# Patient Record
Sex: Male | Born: 1980 | Race: Black or African American | Hispanic: No | Marital: Single | Smoking: Current every day smoker
Health system: Southern US, Community
[De-identification: ages and names within clinical notes are randomized; demographics above are authoritative.]

## PROBLEM LIST (undated history)

## (undated) DIAGNOSIS — E139 Other specified diabetes mellitus without complications: Secondary | ICD-10-CM

## (undated) HISTORY — PX: OTHER SURGICAL HISTORY: SHX169

## (undated) HISTORY — PX: VASECTOMY: SHX75

## (undated) HISTORY — DX: Other specified diabetes mellitus without complications: E13.9

---

## 2009-10-26 ENCOUNTER — Emergency Department (HOSPITAL_COMMUNITY): Admission: EM | Admit: 2009-10-26 | Discharge: 2009-10-26 | Payer: Self-pay | Admitting: Emergency Medicine

## 2010-07-18 IMAGING — CR DG ABDOMEN ACUTE W/ 1V CHEST
3 series · 3 of 3 positions shown · non-contrast
Comparison: None.

CLINICAL DATA: 28-year-old male with abdominal pain, nausea,
vomiting, hyperglycemia.

ACUTE ABDOMEN SERIES (ABDOMEN 2 VIEW & CHEST 1 VIEW)

[w chest pa]
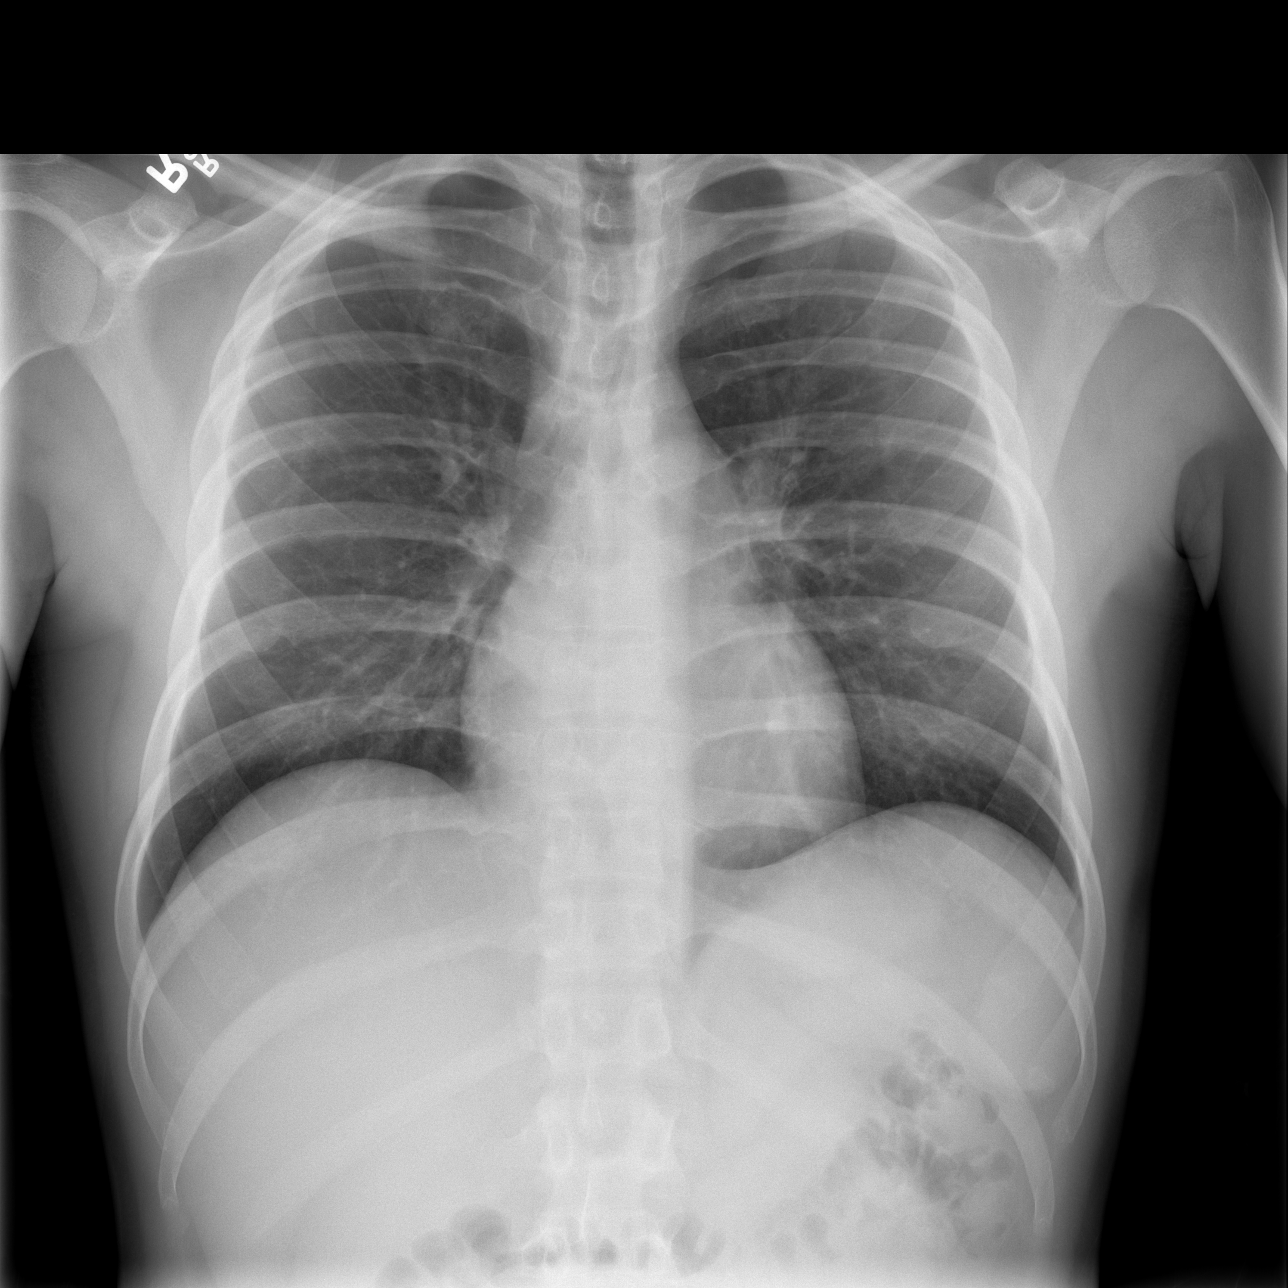

[w abdomen upright]
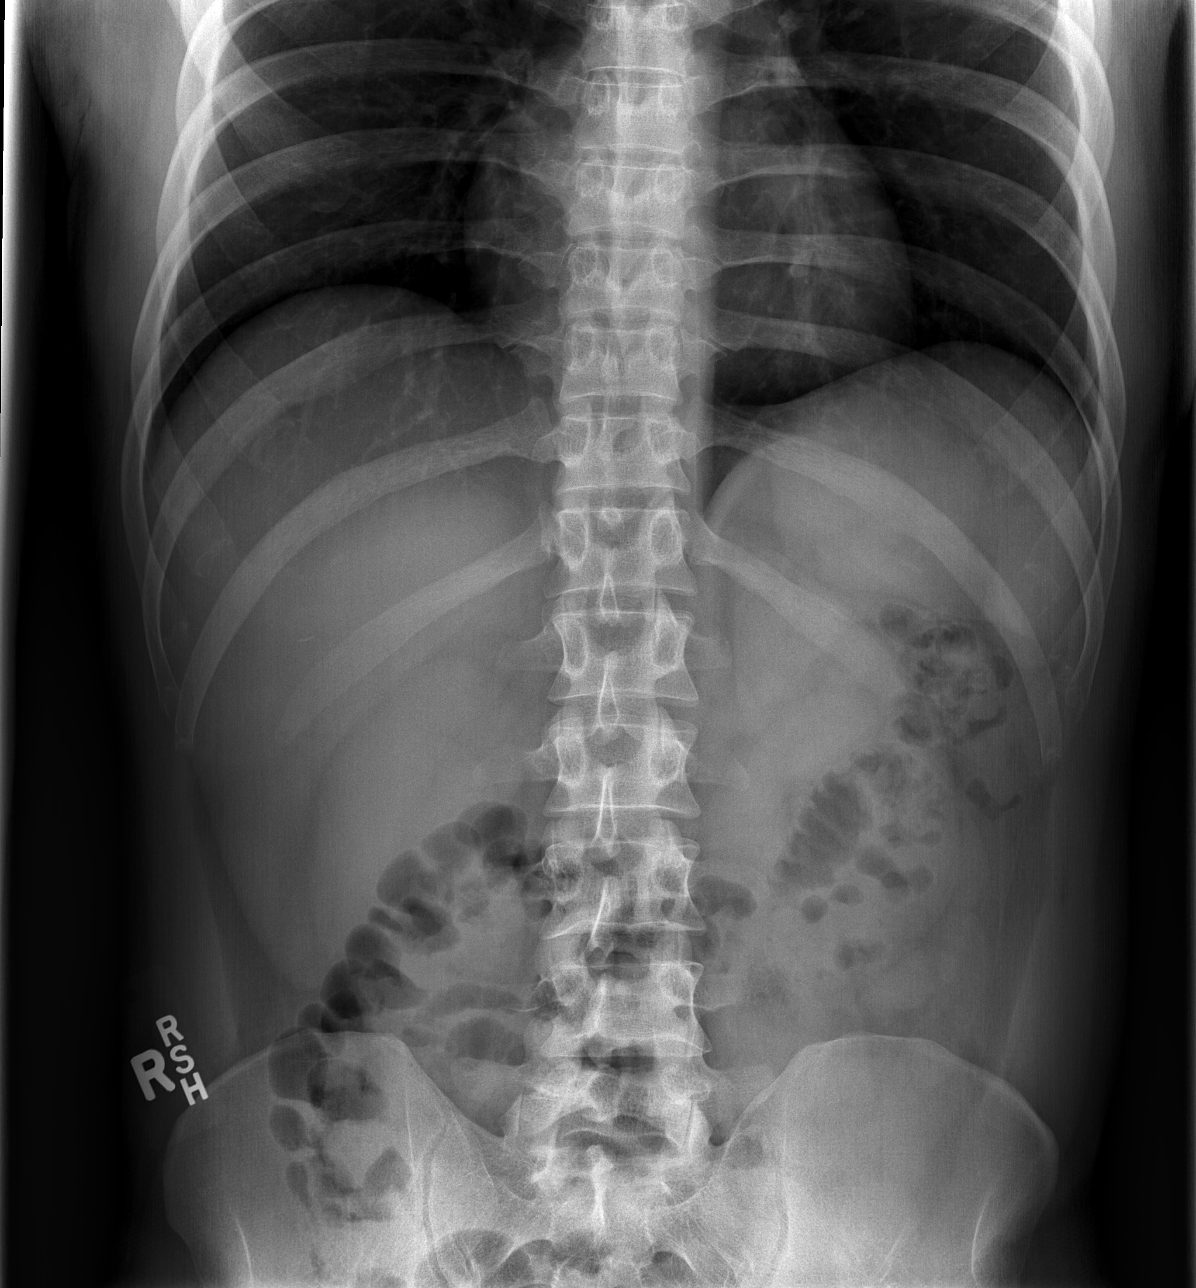

[t abdomen supine]
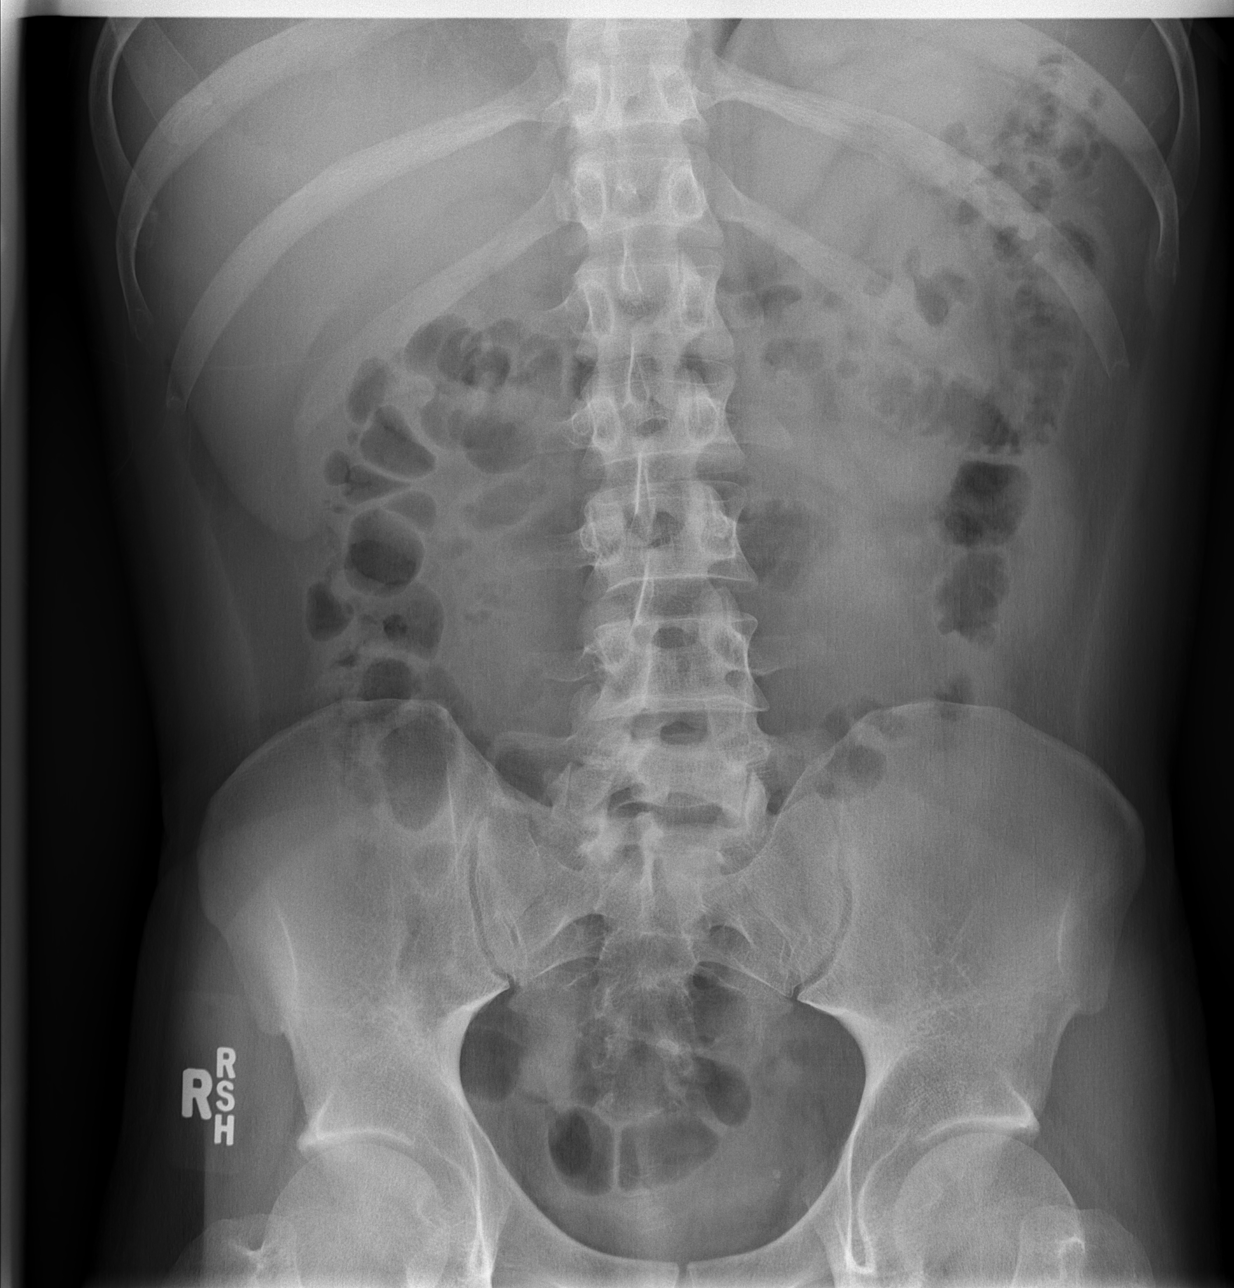

[3 of 3 positions shown; findings below may reference images not displayed]

FINDINGS: Slightly low lung volumes. Normal cardiac size and
mediastinal contours.  No pneumothorax or pneumoperitoneum.  The
lungs are clear.

Normal bowel gas pattern.  Abdominal visceral contours are within
normal limits. No osseous abnormality identified.
IMPRESSION: Negative; normal bowel gas pattern, no free air, and no
cardiopulmonary abnormality.

## 2010-11-23 LAB — DIFFERENTIAL
Basophils Absolute: 0 10*3/uL (ref 0.0–0.1)
Eosinophils Relative: 0 % (ref 0–5)
Eosinophils Relative: 1 % (ref 0–5)
Lymphs Abs: 2.1 10*3/uL (ref 0.7–4.0)
Monocytes Absolute: 0.8 10*3/uL (ref 0.1–1.0)
Monocytes Relative: 6 % (ref 3–12)
Neutro Abs: 15.9 10*3/uL — ABNORMAL HIGH (ref 1.7–7.7)
Neutro Abs: 8.4 10*3/uL — ABNORMAL HIGH (ref 1.7–7.7)
Neutrophils Relative %: 68 % (ref 43–77)

## 2010-11-23 LAB — CBC
HCT: 45 % (ref 39.0–52.0)
Hemoglobin: 15.9 g/dL (ref 13.0–17.0)
MCHC: 35.4 g/dL (ref 30.0–36.0)
Platelets: 168 10*3/uL (ref 150–400)
RBC: 4.88 MIL/uL (ref 4.22–5.81)
RDW: 12.7 % (ref 11.5–15.5)
WBC: 19.2 10*3/uL — ABNORMAL HIGH (ref 4.0–10.5)

## 2010-11-23 LAB — URINALYSIS, ROUTINE W REFLEX MICROSCOPIC
Bilirubin Urine: NEGATIVE
Specific Gravity, Urine: 1.036 — ABNORMAL HIGH (ref 1.005–1.030)
pH: 5.5 (ref 5.0–8.0)

## 2010-11-23 LAB — GLUCOSE, CAPILLARY
Glucose-Capillary: 107 mg/dL — ABNORMAL HIGH (ref 70–99)
Glucose-Capillary: 109 mg/dL — ABNORMAL HIGH (ref 70–99)
Glucose-Capillary: 136 mg/dL — ABNORMAL HIGH (ref 70–99)

## 2010-11-23 LAB — RAPID URINE DRUG SCREEN, HOSP PERFORMED
Barbiturates: NOT DETECTED
Benzodiazepines: NOT DETECTED
Cocaine: NOT DETECTED
Opiates: POSITIVE — AB

## 2010-11-23 LAB — BASIC METABOLIC PANEL
GFR calc non Af Amer: >60 mL/min (ref >60)
Potassium: 3.6 mEq/L (ref 3.5–5.1)

## 2010-11-23 LAB — HEPATIC FUNCTION PANEL
ALT: 21 U/L (ref 0–53)
Alkaline Phosphatase: 83 U/L (ref 39–117)
Indirect Bilirubin: 0.8 mg/dL (ref 0.3–0.9)
Total Bilirubin: 0.9 mg/dL (ref 0.3–1.2)

## 2019-12-16 ENCOUNTER — Other Ambulatory Visit: Payer: Self-pay

## 2019-12-16 ENCOUNTER — Encounter: Payer: Self-pay | Admitting: Family Medicine

## 2019-12-16 ENCOUNTER — Ambulatory Visit: Payer: BC Managed Care – PPO | Admitting: Osteopathic Medicine

## 2019-12-16 ENCOUNTER — Ambulatory Visit (INDEPENDENT_AMBULATORY_CARE_PROVIDER_SITE_OTHER): Payer: BC Managed Care – PPO | Admitting: Family Medicine

## 2019-12-16 VITALS — BP 130/95 | HR 84 | Wt 122.0 lb

## 2019-12-16 DIAGNOSIS — E1040 Type 1 diabetes mellitus with diabetic neuropathy, unspecified: Secondary | ICD-10-CM | POA: Diagnosis not present

## 2019-12-16 DIAGNOSIS — R634 Abnormal weight loss: Secondary | ICD-10-CM

## 2019-12-16 NOTE — Patient Instructions (Signed)
Diabetes Mellitus and Standards of Medical Care Managing diabetes (diabetes mellitus) can be complicated. Your diabetes treatment may be managed by a team of health care providers, including:  A physician who specializes in diabetes (endocrinologist).  A nurse practitioner or physician assistant.  Nurses.  A diet and nutrition specialist (registered dietitian).  A certified diabetes educator (CDE).  An exercise specialist.  A pharmacist.  An eye doctor.  A foot specialist (podiatrist).  A dentist.  A primary care provider.  A mental health provider. Your health care providers follow guidelines to help you get the best quality of care. The following schedule is a general guideline for your diabetes management plan. Your health care providers may give you more specific instructions. Physical exams Upon being diagnosed with diabetes mellitus, and each year after that, your health care provider will ask about your medical and family history. He or she will also do a physical exam. Your exam may include:  Measuring your height, weight, and body mass index (BMI).  Checking your blood pressure. This will be done at every routine medical visit. Your target blood pressure may vary depending on your medical conditions, your age, and other factors.  Thyroid gland exam.  Skin exam.  Screening for damage to your nerves (peripheral neuropathy). This may include checking the pulse in your legs and feet and checking the level of sensation in your hands and feet.  A complete foot exam to inspect the structure and skin of your feet, including checking for cuts, bruises, redness, blisters, sores, or other problems.  Screening for blood vessel (vascular) problems, which may include checking the pulse in your legs and feet and checking your temperature. Blood tests Depending on your treatment plan and your personal needs, you may have the following tests done:  HbA1c (hemoglobin A1c). This  test provides information about blood sugar (glucose) control over the previous 2-3 months. It is used to adjust your treatment plan, if needed. This test will be done: ? At least 2 times a year, if you are meeting your treatment goals. ? 4 times a year, if you are not meeting your treatment goals or if treatment goals have changed.  Lipid testing, including total, LDL, and HDL cholesterol and triglyceride levels. ? The goal for LDL is less than 100 mg/dL (5.5 mmol/L). If you are at high risk for complications, the goal is less than 70 mg/dL (3.9 mmol/L). ? The goal for HDL is 40 mg/dL (2.2 mmol/L) or higher for men and 50 mg/dL (2.8 mmol/L) or higher for women. An HDL cholesterol of 60 mg/dL (3.3 mmol/L) or higher gives some protection against heart disease. ? The goal for triglycerides is less than 150 mg/dL (8.3 mmol/L).  Liver function tests.  Kidney function tests.  Thyroid function tests. Dental and eye exams  Visit your dentist two times a year.  If you have type 1 diabetes, your health care provider may recommend an eye exam 3-5 years after you are diagnosed, and then once a year after your first exam. ? For children with type 1 diabetes, a health care provider may recommend an eye exam when your child is age 10 or older and has had diabetes for 3-5 years. After the first exam, your child should get an eye exam once a year.  If you have type 2 diabetes, your health care provider may recommend an eye exam as soon as you are diagnosed, and then once a year after your first exam. Immunizations   The   yearly flu (influenza) vaccine is recommended for everyone 6 months or older who has diabetes.  The pneumonia (pneumococcal) vaccine is recommended for everyone 2 years or older who has diabetes. If you are 65 or older, you may get the pneumonia vaccine as a series of two separate shots.  The hepatitis B vaccine is recommended for adults shortly after being diagnosed with  diabetes.  Adults and children with diabetes should receive all other vaccines according to age-specific recommendations from the Centers for Disease Control and Prevention (CDC). Mental and emotional health Screening for symptoms of eating disorders, anxiety, and depression is recommended at the time of diagnosis and afterward as needed. If your screening shows that you have symptoms (positive screening result), you may need more evaluation and you may work with a mental health care provider. Treatment plan Your treatment plan will be reviewed at every medical visit. You and your health care provider will discuss:  How you are taking your medicines, including insulin.  Any side effects you are experiencing.  Your blood glucose target goals.  The frequency of your blood glucose monitoring.  Lifestyle habits, such as activity level as well as tobacco, alcohol, and substance use. Diabetes self-management education Your health care provider will assess how well you are monitoring your blood glucose levels and whether you are taking your insulin correctly. He or she may refer you to:  A certified diabetes educator to manage your diabetes throughout your life, starting at diagnosis.  A registered dietitian who can create or review your personal nutrition plan.  An exercise specialist who can discuss your activity level and exercise plan. Summary  Managing diabetes (diabetes mellitus) can be complicated. Your diabetes treatment may be managed by a team of health care providers.  Your health care providers follow guidelines in order to help you get the best quality of care.  Standards of care including having regular physical exams, blood tests, blood pressure monitoring, immunizations, screening tests, and education about how to manage your diabetes.  Your health care providers may also give you more specific instructions based on your individual health. This information is not intended  to replace advice given to you by your health care provider. Make sure you discuss any questions you have with your health care provider. Document Revised: 05/08/2018 Document Reviewed: 05/17/2016 Elsevier Patient Education  2020 [employer]  

## 2019-12-16 NOTE — Assessment & Plan Note (Signed)
Likely related to untreated diabetes.  Check TSH today as well.

## 2019-12-16 NOTE — Progress Notes (Signed)
Jon Harding - 39 y.o. male MRN [id number]  Date of birth: [birth year]  Subjective Chief Complaint  Patient presents with  . Establish Care    HPI Jon Harding is a 39 y.o. male with history of diabetes here today for initial visit.  Reports diagnosis of diabetes in his mid 20's.   Started having symptoms nausea and increased thirst and urination.  Went to hospital and blood sugar was 800.  He states that he was told that he could have type 1 or type 2 diabetes but based on age, body habitus and glucose levels that he most likely had type 1 diabetes.  He has not had regular care for several years due to being uninsured.  He has been checking blood sugars and using his mothers insulin to help manage sugars.  He is unsure what type of insulin he is using. He reports that blood sugars are typically around 200-300.  He has had weight loss but denies increased thirst, urination or fatigue.  He is also having burning and tingling pain in bilateral legs and feet.   He is also a daily smoker, both cigarettes and marijuana.  Not ready to quit at this time.   ROS:  A comprehensive ROS was completed and negative except as noted per HPI   No Known Allergies  Past Medical History:  Diagnosis Date  . Diabetes 1.5, managed as type 1 [facility])     Past Surgical History:  Procedure Laterality Date  . Broken Bone Repair    . VASECTOMY      Social History   Socioeconomic History  . Marital status: Single    Spouse name: Not on file  . Number of children: Not on file  . Years of education: Not on file  . Highest education level: Not on file  Occupational History  . Occupation: [employer]   Tobacco Use  . Smoking status: Current Every Day Smoker    Packs/day: 1.00    Years: 27.00    Pack years: 27.00    Types: Cigarettes    Start date: 09/02/1992  . Smokeless tobacco: Never Used  Substance and Sexual Activity  . Alcohol use: Not on file    Comment: rarely  . Drug use: Yes    Frequency: 7.0  times per week    Types: Marijuana    Comment: daily  . Sexual activity: Yes    Partners: Female  Other Topics Concern  . Not on file  Social History Narrative  . Not on file   Social Determinants of Health   Financial Resource Strain:   . Difficulty of Paying Living Expenses:   Food Insecurity:   . Worried About Programme researcher, broadcasting/film/video in the Last Year:   . Barista in the Last Year:   Transportation Needs:   . Freight forwarder (Medical):   Marland Kitchen Lack of Transportation (Non-Medical):   Physical Activity:   . Days of Exercise per Week:   . Minutes of Exercise per Session:   Stress:   . Feeling of Stress :   Social Connections:   . Frequency of Communication with Friends and Family:   . Frequency of Social Gatherings with Friends and Family:   . Attends Religious Services:   . Active Member of Clubs or Organizations:   . Attends Banker Meetings:   Marland Kitchen Marital Status:     Family History  Problem Relation Age of Onset  . Stroke Mother   . Diabetes  Mother   . Hypertension Mother   . Heart attack Brother   . Hypertension Maternal Uncle   . Stroke Maternal Uncle   . Hypertension Maternal Grandmother     Health Maintenance  Topic Date Due  . URINE MICROALBUMIN  Never done  . HIV Screening  Never done  . TETANUS/TDAP  05/31/2000  . INFLUENZA VACCINE  04/02/2020     ----------------------------------------------------------------------------------------------------------------------------------------------------------------------------------------------------------------- Physical Exam BP (!) 130/95 (BP Location: Right Arm, Patient Position: Sitting, Cuff Size: Small)   Pulse 84   Wt 122 lb (55.3 kg)   SpO2 98%   Physical Exam Constitutional:      Comments: Thin male.   Eyes:     General: No scleral icterus. Cardiovascular:     Rate and Rhythm: Normal rate and regular rhythm.  Pulmonary:     Effort: Pulmonary effort is normal.     Breath  sounds: Normal breath sounds.  Musculoskeletal:     Cervical back: Neck supple.  Skin:    General: Skin is warm and dry.  Neurological:     General: No focal deficit present.     Mental Status: He is alert.  Psychiatric:        Mood and Affect: Mood normal.        Behavior: Behavior normal.     ------------------------------------------------------------------------------------------------------------------------------------------------------------------------------------------------------------------- Assessment and Plan  Weight loss Likely related to untreated diabetes.  Check TSH today as well.   Type 1 diabetes mellitus with diabetic neuropathy, unspecified (HCC) Check a1c, c-peptide, cmp, lipid panel and microalbumin May have had DKA vs HONK previously when first diagnosed based on history.   Discussed standards of care including recommendations of annual eye exam.   Recommend quitting smoking.   Has neuropathy as well, likely related to diabetes.  Discussed trying gabapentin once labs return.    No orders of the defined types were placed in this encounter.   No follow-ups on file.    This visit occurred during the SARS-CoV-2 public health emergency.  Safety protocols were in place, including screening questions prior to the visit, additional usage of staff PPE, and extensive cleaning of exam room while observing appropriate contact time as indicated for disinfecting solutions.

## 2019-12-16 NOTE — Assessment & Plan Note (Signed)
Check a1c, c-peptide, cmp, lipid panel and microalbumin May have had DKA vs HONK previously when first diagnosed based on history.   Discussed standards of care including recommendations of annual eye exam.   Recommend quitting smoking.   Has neuropathy as well, likely related to diabetes.  Discussed trying gabapentin once labs return.

## 2019-12-17 LAB — CBC
HCT: 42.7 % (ref 38.5–50.0)
Hemoglobin: 14.4 g/dL (ref 13.2–17.1)
MCH: 30.5 pg (ref 27.0–33.0)
MCHC: 33.7 g/dL (ref 32.0–36.0)
MCV: 90.5 fL (ref 80.0–100.0)
MPV: 10.1 fL (ref 7.5–12.5)
Platelets: 244 10*3/uL (ref 140–400)
RBC: 4.72 10*6/uL (ref 4.20–5.80)
RDW: 12.5 % (ref 11.0–15.0)
WBC: 8.3 10*3/uL (ref 3.8–10.8)

## 2019-12-17 LAB — C-PEPTIDE: C-Peptide: 2.09 ng/mL (ref 0.80–3.85)

## 2019-12-17 LAB — COMPLETE METABOLIC PANEL WITH GFR
AG Ratio: 1.5 (calc) (ref 1.0–2.5)
ALT: 10 U/L (ref 9–46)
AST: 9 U/L — ABNORMAL LOW (ref 10–40)
Albumin: 4.2 g/dL (ref 3.6–5.1)
Alkaline phosphatase (APISO): 78 U/L (ref 36–130)
BUN: 8 mg/dL (ref 7–25)
CO2: 30 mmol/L (ref 20–32)
Calcium: 9.7 mg/dL (ref 8.6–10.3)
Chloride: 104 mmol/L (ref 98–110)
Creat: 0.68 mg/dL (ref 0.60–1.35)
GFR, Est African American: 138 mL/min/{1.73_m2} (ref >=60)
GFR, Est Non African American: 119 mL/min/{1.73_m2} (ref >=60)
Globulin: 2.8 g/dL (calc) (ref 1.9–3.7)
Glucose, Bld: 323 mg/dL — ABNORMAL HIGH (ref 65–139)
Potassium: 4.7 mmol/L (ref 3.5–5.3)
Sodium: 139 mmol/L (ref 135–146)
Total Bilirubin: 0.3 mg/dL (ref 0.2–1.2)
Total Protein: 7 g/dL (ref 6.1–8.1)

## 2019-12-17 LAB — LIPID PANEL
Cholesterol: 131 mg/dL (ref <200)
HDL: 39 mg/dL — ABNORMAL LOW (ref >=40)
LDL Cholesterol (Calc): 64 mg/dL (calc)
Non-HDL Cholesterol (Calc): 92 mg/dL (calc) (ref <130)
Total CHOL/HDL Ratio: 3.4 (calc) (ref <5.0)
Triglycerides: 216 mg/dL — ABNORMAL HIGH (ref <150)

## 2019-12-17 LAB — TSH: TSH: 1.11 mIU/L (ref 0.40–4.50)

## 2019-12-17 LAB — MICROALBUMIN / CREATININE URINE RATIO
Creatinine, Urine: 47 mg/dL (ref 20–320)
Microalb Creat Ratio: 19 mcg/mg creat (ref <30)
Microalb, Ur: 0.9 mg/dL

## 2019-12-21 ENCOUNTER — Other Ambulatory Visit: Payer: Self-pay | Admitting: Family Medicine

## 2019-12-22 MED ORDER — FREESTYLE LIBRE 14 DAY SENSOR MISC: 1 refills | Status: AC

## 2019-12-22 MED ORDER — FREESTYLE LIBRE 14 DAY READER DEVI
1.0000 | Freq: Every day | 0 refills | Status: AC
Start: 2019-12-22 — End: ?

## 2019-12-22 MED ORDER — INSULIN PEN NEEDLE 30G X 8 MM MISC: 3 refills | Status: AC

## 2019-12-22 MED ORDER — LANTUS SOLOSTAR 100 UNIT/ML ~~LOC~~ SOPN: 15.0000 [IU] | PEN_INJECTOR | Freq: Every day | SUBCUTANEOUS | 0 refills | Status: AC

## 2020-02-03 ENCOUNTER — Ambulatory Visit: Payer: BC Managed Care – PPO | Admitting: Family Medicine

## 2020-02-23 LAB — HEMOGLOBIN A1C: Hemoglobin A1C: 7.5

## 2020-02-24 ENCOUNTER — Inpatient Hospital Stay: Payer: BLUE CROSS/BLUE SHIELD | Admitting: Family Medicine

## 2020-03-08 ENCOUNTER — Encounter: Payer: Self-pay | Admitting: Family Medicine

## 2020-10-06 ENCOUNTER — Telehealth: Payer: Self-pay | Admitting: *Deleted

## 2020-10-06 NOTE — Telephone Encounter (Signed)
Transition Care Management Follow-up Telephone Call  Date of discharge and from where: 10/05/2020  How have you been since you were released from the hospital? "Feeling worse"  Any questions or concerns? No  Items Reviewed:  Did the pt receive and understand the discharge instructions provided? Yes   Medications obtained and verified? Yes   Other? N/A  Any new allergies since your discharge? No   Dietary orders reviewed? Yes  Do you have support at home? Yes   Home Care and Equipment/Supplies: Were home health services ordered? not applicable If so, what is the name of the agency? N/A  Has the agency set up a time to come to the patient's home? not applicable Were any new equipment or medical supplies ordered?  No What is the name of the medical supply agency? N/A Were you able to get the supplies/equipment? not applicable Do you have any questions related to the use of the equipment or supplies? No  Functional Questionnaire: (I = Independent and D = Dependent) ADLs: I  Bathing/Dressing- I  Meal Prep- I  Eating- I  Maintaining continence- I  Transferring/Ambulation- I  Managing Meds- I  Follow up appointments reviewed:   PCP Hospital f/u appt confirmed? [facility] is no longer pt's PCP   Specialist Hospital f/u appt confirmed? Pt is to be set up with GI - he will follow up on this   Are transportation arrangements needed? No   If their condition worsens, is the pt aware to call PCP or go to the Emergency Dept.? Yes  Was the patient provided with contact information for the PCP's office or ED? Yes  Was to pt encouraged to call back with questions or concerns? Yes
